# Patient Record
Sex: Female | Born: 1995 | Race: White | Hispanic: No | Marital: Single | Smoking: Never smoker
Health system: Southern US, Community
[De-identification: ages and names within clinical notes are randomized; demographics above are authoritative.]

## PROBLEM LIST (undated history)

## (undated) DIAGNOSIS — F419 Anxiety disorder, unspecified: Secondary | ICD-10-CM

## (undated) DIAGNOSIS — F603 Borderline personality disorder: Secondary | ICD-10-CM

## (undated) DIAGNOSIS — I10 Essential (primary) hypertension: Secondary | ICD-10-CM

---

## 2015-02-16 ENCOUNTER — Encounter (HOSPITAL_BASED_OUTPATIENT_CLINIC_OR_DEPARTMENT_OTHER): Payer: Self-pay | Admitting: Emergency Medicine

## 2015-02-16 ENCOUNTER — Emergency Department (HOSPITAL_BASED_OUTPATIENT_CLINIC_OR_DEPARTMENT_OTHER)
Admission: EM | Admit: 2015-02-16 | Discharge: 2015-02-16 | Disposition: A | Discharge status: Home / Self Care | Payer: Self-pay | Attending: Emergency Medicine | Admitting: Emergency Medicine

## 2015-02-16 DIAGNOSIS — E669 Obesity, unspecified: Secondary | ICD-10-CM | POA: Insufficient documentation

## 2015-02-16 DIAGNOSIS — G43A0 Cyclical vomiting, not intractable: Secondary | ICD-10-CM | POA: Insufficient documentation

## 2015-02-16 DIAGNOSIS — N912 Amenorrhea, unspecified: Secondary | ICD-10-CM | POA: Insufficient documentation

## 2015-02-16 DIAGNOSIS — Z3202 Encounter for pregnancy test, result negative: Secondary | ICD-10-CM | POA: Insufficient documentation

## 2015-02-16 DIAGNOSIS — R1110 Vomiting, unspecified: Secondary | ICD-10-CM

## 2015-02-16 LAB — CBC WITH DIFFERENTIAL/PLATELET
BASOS ABS: 0 10*3/uL (ref 0.0–0.1)
BASOS PCT: 0 % (ref 0–1)
EOS PCT: 1 % (ref 0–5)
Eosinophils Absolute: 0.1 10*3/uL (ref 0.0–0.7)
HEMATOCRIT: 39.4 % (ref 36.0–46.0)
HEMOGLOBIN: 12.6 g/dL (ref 12.0–15.0)
LYMPHS ABS: 3 10*3/uL (ref 0.7–4.0)
Lymphocytes Relative: 29 % (ref 12–46)
MCH: 26.9 pg (ref 26.0–34.0)
MCHC: 32 g/dL (ref 30.0–36.0)
MCV: 84.2 fL (ref 78.0–100.0)
MONOS PCT: 6 % (ref 3–12)
Monocytes Absolute: 0.6 10*3/uL (ref 0.1–1.0)
NEUTROS ABS: 6.6 10*3/uL (ref 1.7–7.7)
Neutrophils Relative %: 64 % (ref 43–77)
Platelets: 352 10*3/uL (ref 150–400)
RBC: 4.68 MIL/uL (ref 3.87–5.11)
RDW: 13.1 % (ref 11.5–15.5)
WBC: 10.5 10*3/uL (ref 4.0–10.5)

## 2015-02-16 LAB — COMPREHENSIVE METABOLIC PANEL
ALK PHOS: 127 U/L — AB (ref 38–126)
ALT: 18 U/L (ref 14–54)
ANION GAP: 9 (ref 5–15)
AST: 23 U/L (ref 15–41)
Albumin: 3.8 g/dL (ref 3.5–5.0)
BUN: 7 mg/dL (ref 6–20)
CO2: 23 mmol/L (ref 22–32)
Calcium: 9.3 mg/dL (ref 8.9–10.3)
Chloride: 105 mmol/L (ref 101–111)
Creatinine, Ser: 0.68 mg/dL (ref 0.44–1.00)
Glucose, Bld: 95 mg/dL (ref 65–99)
POTASSIUM: 4.2 mmol/L (ref 3.5–5.1)
Sodium: 137 mmol/L (ref 135–145)
Total Bilirubin: 0.5 mg/dL (ref 0.3–1.2)
Total Protein: 7.6 g/dL (ref 6.5–8.1)

## 2015-02-16 LAB — URINALYSIS, ROUTINE W REFLEX MICROSCOPIC
Bilirubin Urine: NEGATIVE
Glucose, UA: NEGATIVE mg/dL
Ketones, ur: NEGATIVE mg/dL
Leukocytes, UA: NEGATIVE
NITRITE: NEGATIVE
PH: 5.5 (ref 5.0–8.0)
PROTEIN: NEGATIVE mg/dL
Specific Gravity, Urine: 1.03 (ref 1.005–1.030)
UROBILINOGEN UA: 1 mg/dL (ref 0.0–1.0)

## 2015-02-16 LAB — URINE MICROSCOPIC-ADD ON

## 2015-02-16 LAB — LIPASE, BLOOD: Lipase: 11 U/L — ABNORMAL LOW (ref 22–51)

## 2015-02-16 LAB — HCG, QUANTITATIVE, PREGNANCY: hCG, Beta Chain, Quant, S: <1 m[IU]/mL (ref <5)

## 2015-02-16 MED ORDER — SODIUM CHLORIDE 0.9 % IV BOLUS (SEPSIS)
1000.0000 mL | Freq: Once | INTRAVENOUS | Status: AC
  Administered 2015-02-16: 1000 mL via INTRAVENOUS

## 2015-02-16 MED ORDER — PROMETHAZINE HCL 25 MG PO TABS: 25.0000 mg | ORAL_TABLET | Freq: Four times a day (QID) | ORAL | Status: AC | PRN

## 2015-02-16 MED ORDER — ONDANSETRON HCL 4 MG/2ML IJ SOLN
4.0000 mg | Freq: Once | INTRAMUSCULAR | Status: AC
  Administered 2015-02-16: 4 mg via INTRAVENOUS
  Filled 2015-02-16: qty 2

## 2015-02-16 NOTE — ED Provider Notes (Addendum)
CSN: [id number]     Arrival date & time 02/16/15  0228 History   First MD Initiated Contact with Patient 02/16/15 0239     Chief Complaint  Patient presents with  . Nausea  . Emesis     (Consider location/radiation/quality/duration/timing/severity/associated sxs/prior Treatment) HPI Comments: Patient states her friend works for EMS and check her blood pressure earlier today and it was 210/120. He suggested that she come to the hospital for further care. Patient states in the past she has had an issue with her blood pressure but she takes no medications for it. She was initially triaged to [facility] however the wait was so long she decided to leave. At that time her blood pressure was checked and it was 150/90. She has had nausea and vomiting since 8 AM on Monday morning. She has vomited 6 times and is unable to hold down any food. She's had some mild back pain but denies any severe back pain ongoing abdominal pain. No dysuria, hematuria, vaginal discharge or bleeding. Last menses was January. She states she has issues with ovulation and does not have normal periods.  Patient is a 19 y.o. female presenting with vomiting. The history is provided by the patient.  Emesis Severity:  Severe Duration:  1 day Timing:  Constant Quality:  Stomach contents Progression:  Unchanged Chronicity:  New Relieved by:  None tried Worsened by:  Food smell and liquids Associated symptoms: abdominal pain   Associated symptoms: no chills, no cough, no diarrhea and no fever   Risk factors: no diabetes, not pregnant now, no prior abdominal surgery, no sick contacts, no suspect food intake and no travel to endemic areas     History reviewed. No pertinent past medical history. History reviewed. No pertinent past surgical history. No family history on file. History  Substance Use Topics  . Smoking status: Never Smoker   . Smokeless tobacco: Not on file  . Alcohol Use: No   OB History    No data  available     Review of Systems  Constitutional: Negative for chills.  Gastrointestinal: Positive for vomiting and abdominal pain. Negative for diarrhea.  All other systems reviewed and are negative.     Allergies  Review of patient's allergies indicates no known allergies.  Home Medications   Prior to Admission medications   Not on File   BP 106/75 mmHg  Pulse 88  Temp(Src) 98.5 F (36.9 C) (Oral)  Resp 20  Ht  (1.753 m)  Wt 250 lb (113.399 kg)  BMI 36.90 kg/m2  SpO2 99% Physical Exam  Constitutional: She is oriented to person, place, and time. She appears well-developed and well-nourished. No distress.  Obese female  HENT:  Head: Normocephalic and atraumatic.  Mouth/Throat: Oropharynx is clear and moist. Mucous membranes are dry.  Eyes: Conjunctivae and EOM are normal. Pupils are equal, round, and reactive to light.  Neck: Normal range of motion. Neck supple.  Cardiovascular: Normal rate, regular rhythm and intact distal pulses.   No murmur heard. Pulmonary/Chest: Effort normal and breath sounds normal. No respiratory distress. She has no wheezes. She has no rales.  Abdominal: Soft. She exhibits no distension. There is tenderness in the right upper quadrant, right lower quadrant, suprapubic area and left lower quadrant. There is no rebound and no guarding.  Diffuse abdominal tenderness which patient states is chronic  Musculoskeletal: Normal range of motion. She exhibits no edema or tenderness.  Neurological: She is alert and oriented to person, place, and  time.  Skin: Skin is warm and dry. No rash noted. No erythema.  Psychiatric: She has a normal mood and affect. Her behavior is normal.  Nursing note and vitals reviewed.   ED Course  Procedures (including critical care time) Labs Review Labs Reviewed  URINALYSIS, ROUTINE W REFLEX MICROSCOPIC (NOT AT [facility]) - Abnormal; Notable for the following:    APPearance CLOUDY (*)    Hgb urine dipstick MODERATE (*)     All other components within normal limits  COMPREHENSIVE METABOLIC PANEL - Abnormal; Notable for the following:    Alkaline Phosphatase 127 (*)    All other components within normal limits  LIPASE, BLOOD - Abnormal; Notable for the following:    Lipase 11 (*)    All other components within normal limits  URINE MICROSCOPIC-ADD ON - Abnormal; Notable for the following:    Squamous Epithelial / LPF MANY (*)    Bacteria, UA MANY (*)    All other components within normal limits  CBC WITH DIFFERENTIAL/PLATELET  HCG, QUANTITATIVE, PREGNANCY    Imaging Review No results found.   EKG Interpretation None      MDM   Final diagnoses:  Intractable vomiting with nausea, vomiting of unspecified type  Anovulatory amenorrhea    Patient here with persistent nausea and vomiting since yesterday and had a.m. Earlier her blood pressure was elevated to 210/120 checked recurrently with persistent high numbers. She initially went to [facility] emergency room and her pressure was 150 systolic fair however by the time she checked in here her blood pressure was 106/75. She has a history of fluctuating blood pressures but denies taking any medication. She's not had a normal menses since January but states she has an issue with ovulating. She denies any vaginal discharge or dysuria. No prior history of kidney stones. She does not use tobacco, alcohol or drugs. On exam she has mild diffuse abdominal tenderness which she states is her baseline. She denies any diarrhea today. No recent and extensor concern for C. difficile at this time.  UA, UPT, CBC, CMP, lipase pending. We'll give IV fluids and nausea medication. Low suspicion at this time the patient has appendicitis however possibility for cholecystitis. Will see how patient feels after IV hydration. We'll discuss possible ultrasound if labs are within normal limits  4:15 AM  HCG and WBC wnl.  Rest of labs still pending.  5:12 AM Labs are all wnl.  Pt has  had no vomiting here and able to po challenge without difficulty.  Pt will need f/u with OB/GYN due to irregular periods but at this time do not feel that patient has new acute issue that needs further evaluation. She lives in [place] and will seek follow-up there.  Gwyneth Sprout, MD 02/16/15 [id number]  Gwyneth Sprout, MD 02/16/15 1191

## 2015-02-16 NOTE — ED Notes (Signed)
MD at bedside. 

## 2015-02-16 NOTE — ED Notes (Signed)
C/o N/V all day yesterday since 0800. Report BP was 210/120 around 12 noon at home. bp 106/75 at triage. Denies taking any medications.

## 2015-02-16 NOTE — Discharge Instructions (Signed)

## 2015-02-18 ENCOUNTER — Emergency Department (HOSPITAL_BASED_OUTPATIENT_CLINIC_OR_DEPARTMENT_OTHER): Payer: Self-pay

## 2015-02-18 ENCOUNTER — Emergency Department (HOSPITAL_BASED_OUTPATIENT_CLINIC_OR_DEPARTMENT_OTHER)
Admission: EM | Admit: 2015-02-18 | Discharge: 2015-02-18 | Disposition: A | Discharge status: Home / Self Care | Payer: Self-pay | Attending: Emergency Medicine | Admitting: Emergency Medicine

## 2015-02-18 ENCOUNTER — Encounter (HOSPITAL_BASED_OUTPATIENT_CLINIC_OR_DEPARTMENT_OTHER): Payer: Self-pay

## 2015-02-18 DIAGNOSIS — R112 Nausea with vomiting, unspecified: Secondary | ICD-10-CM | POA: Insufficient documentation

## 2015-02-18 DIAGNOSIS — E669 Obesity, unspecified: Secondary | ICD-10-CM | POA: Insufficient documentation

## 2015-02-18 DIAGNOSIS — R110 Nausea: Secondary | ICD-10-CM

## 2015-02-18 DIAGNOSIS — R1084 Generalized abdominal pain: Secondary | ICD-10-CM | POA: Insufficient documentation

## 2015-02-18 DIAGNOSIS — Z3202 Encounter for pregnancy test, result negative: Secondary | ICD-10-CM | POA: Insufficient documentation

## 2015-02-18 DIAGNOSIS — R319 Hematuria, unspecified: Secondary | ICD-10-CM | POA: Insufficient documentation

## 2015-02-18 LAB — URINALYSIS, ROUTINE W REFLEX MICROSCOPIC
Bilirubin Urine: NEGATIVE
Glucose, UA: NEGATIVE mg/dL
Ketones, ur: NEGATIVE mg/dL
Leukocytes, UA: NEGATIVE
NITRITE: NEGATIVE
Protein, ur: NEGATIVE mg/dL
SPECIFIC GRAVITY, URINE: 1.025 (ref 1.005–1.030)
Urobilinogen, UA: 1 mg/dL (ref 0.0–1.0)
pH: 5.5 (ref 5.0–8.0)

## 2015-02-18 LAB — URINE MICROSCOPIC-ADD ON

## 2015-02-18 LAB — PREGNANCY, URINE: Preg Test, Ur: NEGATIVE

## 2015-02-18 MED ORDER — RANITIDINE HCL 150 MG PO TABS: 150.0000 mg | ORAL_TABLET | Freq: Two times a day (BID) | ORAL | Status: AC

## 2015-02-18 NOTE — ED Provider Notes (Signed)
CSN: [id number]     Arrival date & time 02/18/15  0927 History   First MD Initiated Contact with Patient 02/18/15 [phone]     Chief Complaint  Patient presents with  . Emesis     (Consider location/radiation/quality/duration/timing/severity/associated sxs/prior Treatment) HPI Comments: Patient presents with nausea and vomiting. She states that for about the last 6-7 months she's had intermittent episodes of nausea and vomiting. She describes soreness across her abdomen she is also been going on since about January. It comes and goes and isn't different spots in her abdomen. She's had some sharp pains to her right upper quadrant over the last few days. She was seen here on August 2 with similar symptoms and had blood work that was unremarkable. Her alkaline phosphatase was mildly elevated. She was under the impression that she was going to have an outpatient gallbladder ultrasound ordered. Apparently there is a miscommunication about this so she checked back in for reevaluation to get the gallbladder ultrasound. She normally has irregular periods and states that she has ovulation issues. She denies any vaginal bleeding or discharge. She denies any urinary symptoms. She denies any fevers or chills. Her last emesis was one time yesterday. She's been tolerating food since that time without vomiting.  Patient is a 19 y.o. female presenting with vomiting.  Emesis Associated symptoms: abdominal pain   Associated symptoms: no arthralgias, no chills, no diarrhea and no headaches     History reviewed. No pertinent past medical history. History reviewed. No pertinent past surgical history. No family history on file. History  Substance Use Topics  . Smoking status: Never Smoker   . Smokeless tobacco: Not on file  . Alcohol Use: No   OB History    No data available     Review of Systems  Constitutional: Negative for fever, chills, diaphoresis and fatigue.  HENT: Negative for congestion, rhinorrhea  and sneezing.   Eyes: Negative.   Respiratory: Negative for cough, chest tightness and shortness of breath.   Cardiovascular: Negative for chest pain and leg swelling.  Gastrointestinal: Positive for nausea, vomiting and abdominal pain. Negative for diarrhea and blood in stool.  Genitourinary: Negative for frequency, hematuria, flank pain, vaginal bleeding, vaginal discharge and difficulty urinating.  Musculoskeletal: Negative for back pain and arthralgias.  Skin: Negative for rash.  Neurological: Negative for dizziness, speech difficulty, weakness, numbness and headaches.      Allergies  Review of patient's allergies indicates no known allergies.  Home Medications   Prior to Admission medications   Medication Sig Start Date End Date Taking? Authorizing Provider  promethazine (PHENERGAN) 25 MG tablet Take 1 tablet (25 mg total) by mouth every 6 (six) hours as needed for nausea or vomiting. 02/16/15   Gwyneth Sprout, MD  ranitidine (ZANTAC) 150 MG tablet Take 1 tablet (150 mg total) by mouth 2 (two) times daily. 02/18/15   Rolan Bucco, MD   BP 126/72 mmHg  Pulse 75  Temp(Src) 98.5 F (36.9 C) (Oral)  Resp 18  Ht  (1.753 m)  Wt 250 lb (113.399 kg)  BMI 36.90 kg/m2  SpO2 100%  LMP 08/05/2014 Physical Exam  Constitutional: She is oriented to person, place, and time. She appears well-developed and well-nourished.  obese  HENT:  Head: Normocephalic and atraumatic.  Eyes: Pupils are equal, round, and reactive to light.  Neck: Normal range of motion. Neck supple.  Cardiovascular: Normal rate, regular rhythm and normal heart sounds.   Pulmonary/Chest: Effort normal and breath sounds normal. No  respiratory distress. She has no wheezes. She has no rales. She exhibits no tenderness.  Abdominal: Soft. Bowel sounds are normal. There is tenderness (mild diffuse tenderness). There is no rebound and no guarding.  Musculoskeletal: Normal range of motion. She exhibits no edema.   Lymphadenopathy:    She has no cervical adenopathy.  Neurological: She is alert and oriented to person, place, and time.  Skin: Skin is warm and dry. No rash noted.  Psychiatric: She has a normal mood and affect.    ED Course  Procedures (including critical care time) Labs Review Labs Reviewed  URINALYSIS, ROUTINE W REFLEX MICROSCOPIC (NOT AT [facility]) - Abnormal; Notable for the following:    APPearance CLOUDY (*)    Hgb urine dipstick MODERATE (*)    All other components within normal limits  URINE MICROSCOPIC-ADD ON - Abnormal; Notable for the following:    Squamous Epithelial / LPF FEW (*)    Bacteria, UA MANY (*)    All other components within normal limits  PREGNANCY, URINE    Imaging Review US Abdomen Complete  02/18/2015   CLINICAL DATA:  Sporadic diffuse abdominal pain and nausea for 4-5 months.  EXAM: ULTRASOUND ABDOMEN COMPLETE  COMPARISON:  None.  FINDINGS: Gallbladder: Gallbladder has a normal appearance. Gallbladder wall is 2.0 mm, within normal limits. No stones or pericholecystic fluid. No sonographic Murphy's sign.  Common bile duct: Diameter: 2.0 mm  Liver: Mildly echogenic.  No focal liver lesions.  IVC: No abnormality visualized.  Pancreas: Visualized portion unremarkable.  Spleen: Size and appearance within normal limits.  Right Kidney: Length: 11.7 cm. Echogenicity within normal limits. No mass or hydronephrosis visualized.  Left Kidney: Length: 11.7 cm. Echogenicity within normal limits. No mass or hydronephrosis visualized.  Abdominal aorta: No aneurysm visualized.  Other findings: Study is technically compromised by significant bowel gas and patient body habitus.  IMPRESSION: 1. Mildly echogenic liver. 2. No evidence for acute  abnormality.   Electronically Signed   By: Norva Pavlov M.D.   On: 02/18/2015 11:27     EKG Interpretation None      MDM   Final diagnoses:  Nausea  Generalized abdominal pain  Hematuria    Patient presents with nausea and  generalized abdominal pain. She states her abdominal pain is been going on since about January. It comes and goes. Her abdominal exam is non-concerning. I did do an ultrasound which was unremarkable. She has some microscopic hematuria which was also noted on her last urine 2 days ago. She denies any vaginal bleeding. She denies any urinary symptoms. I encouraged her to follow-up with her primary care physician for this. She also had blood work done 2 days ago which was normal other than elevated alkaline phosphatase, so I did not feel that this need to be repeated today. I did refer her to [facility] for further treatment. I gave her prescription for Zantac and she can continue her Phenergan as needed.    Rolan Bucco, MD 02/18/15 [phone]

## 2015-02-18 NOTE — Discharge Instructions (Signed)
Abdominal Pain, Women °Abdominal (stomach, pelvic, or belly) pain can be caused by many things. It is important to tell your doctor: °· The location of the pain. °· Does it come and go or is it present all the time? °· Are there things that start the pain (eating certain foods, exercise)? °· Are there other symptoms associated with the pain (fever, nausea, vomiting, diarrhea)? °All of this is helpful to know when trying to find the cause of the pain. °CAUSES  °· Stomach: virus or bacteria infection, or ulcer. °· Intestine: appendicitis (inflamed appendix), regional ileitis (Crohn's disease), ulcerative colitis (inflamed colon), irritable bowel syndrome, diverticulitis (inflamed diverticulum of the colon), or cancer of the stomach or intestine. °· Gallbladder disease or stones in the gallbladder. °· Kidney disease, kidney stones, or infection. °· Pancreas infection or cancer. °· Fibromyalgia (pain disorder). °· Diseases of the female organs: °· Uterus: fibroid (non-cancerous) tumors or infection. °· Fallopian tubes: infection or tubal pregnancy. °· Ovary: cysts or tumors. °· Pelvic adhesions (scar tissue). °· Endometriosis (uterus lining tissue growing in the pelvis and on the pelvic organs). °· Pelvic congestion syndrome (female organs filling up with blood just before the menstrual period). °· Pain with the menstrual period. °· Pain with ovulation (producing an egg). °· Pain with an IUD (intrauterine device, birth control) in the uterus. °· Cancer of the female organs. °· Functional pain (pain not caused by a disease, may improve without treatment). °· Psychological pain. °· Depression. °DIAGNOSIS  °Your doctor will decide the seriousness of your pain by doing an examination. °· Blood tests. °· X-rays. °· Ultrasound. °· CT scan (computed tomography, special type of X-ray). °· MRI (magnetic resonance imaging). °· Cultures, for infection. °· Barium enema (dye inserted in the large intestine, to better view it with  X-rays). °· Colonoscopy (looking in intestine with a lighted tube). °· Laparoscopy (minor surgery, looking in abdomen with a lighted tube). °· Major abdominal exploratory surgery (looking in abdomen with a large incision). °TREATMENT  °The treatment will depend on the cause of the pain.  °· Many cases can be observed and treated at home. °· Over-the-counter medicines recommended by your caregiver. °· Prescription medicine. °· Antibiotics, for infection. °· Birth control pills, for painful periods or for ovulation pain. °· Hormone treatment, for endometriosis. °· Nerve blocking injections. °· Physical therapy. °· Antidepressants. °· Counseling with a psychologist or psychiatrist. °· Minor or major surgery. °HOME CARE INSTRUCTIONS  °· Do not take laxatives, unless directed by your caregiver. °· Take over-the-counter pain medicine only if ordered by your caregiver. Do not take aspirin because it can cause an upset stomach or bleeding. °· Try a clear liquid diet (broth or water) as ordered by your caregiver. Slowly move to a bland diet, as tolerated, if the pain is related to the stomach or intestine. °· Have a thermometer and take your temperature several times a day, and record it. °· Bed rest and sleep, if it helps the pain. °· Avoid sexual intercourse, if it causes pain. °· Avoid stressful situations. °· Keep your follow-up appointments and tests, as your caregiver orders. °· If the pain does not go away with medicine or surgery, you may try: °¨ Acupuncture. °¨ Relaxation exercises (yoga, meditation). °¨ Group therapy. °¨ Counseling. °SEEK MEDICAL CARE IF:  °· You notice certain foods cause stomach pain. °· Your home care treatment is not helping your pain. °· You need stronger pain medicine. °· You want your IUD removed. °· You feel faint or   lightheaded.  You develop nausea and vomiting.  You develop a rash.  You are having side effects or an allergy to your medicine. SEEK IMMEDIATE MEDICAL CARE IF:   Your  pain does not go away or gets worse.  You have a fever.  Your pain is felt only in portions of the abdomen. The right side could possibly be appendicitis. The left lower portion of the abdomen could be colitis or diverticulitis.  You are passing blood in your stools (bright red or black tarry stools, with or without vomiting).  You have blood in your urine.  You develop chills, with or without a fever.  You pass out. MAKE SURE YOU:   Understand these instructions.  Will watch your condition.  Will get help right away if you are not doing well or get worse. Document Released: 04/30/2007 Document Revised: 11/17/2013 Document Reviewed: 05/20/2009 [facility] Patient Information 2015 [place], [place]. This information is not intended to replace advice given to you by your health care provider. Make sure you discuss any questions you have with your health care provider.  Hematuria Hematuria is blood in your urine. It can be caused by a bladder infection, kidney infection, prostate infection, kidney stone, or cancer of your urinary tract. Infections can usually be treated with medicine, and a kidney stone usually will pass through your urine. If neither of these is the cause of your hematuria, further workup to find out the reason may be needed. It is very important that you tell your health care provider about any blood you see in your urine, even if the blood stops without treatment or happens without causing pain. Blood in your urine that happens and then stops and then happens again can be a symptom of a very serious condition. Also, pain is not a symptom in the initial stages of many urinary cancers. HOME CARE INSTRUCTIONS   Drink lots of fluid, 3-4 quarts a day. If you have been diagnosed with an infection, cranberry juice is especially recommended, in addition to large amounts of water.  Avoid caffeine, tea, and carbonated beverages because they tend to irritate the bladder.  Avoid  alcohol because it may irritate the prostate.  Take all medicines as directed by your health care provider.  If you were prescribed an antibiotic medicine, finish it all even if you start to feel better.  If you have been diagnosed with a kidney stone, follow your health care provider's instructions regarding straining your urine to catch the stone.  Empty your bladder often. Avoid holding urine for long periods of time.  After a bowel movement, women should cleanse front to back. Use each tissue only once.  Empty your bladder before and after sexual intercourse if you are a female. SEEK MEDICAL CARE IF:  You develop back pain.  You have a fever.  You have a feeling of sickness in your stomach (nausea) or vomiting.  Your symptoms are not better in 3 days. Return sooner if you are getting worse. SEEK IMMEDIATE MEDICAL CARE IF:   You develop severe vomiting and are unable to keep the medicine down.  You develop severe back or abdominal pain despite taking your medicines.  You begin passing a large amount of blood or clots in your urine.  You feel extremely weak or faint, or you pass out. MAKE SURE YOU:   Understand these instructions.  Will watch your condition.  Will get help right away if you are not doing well or get worse. Document Released:  07/03/2005 Document Revised: 11/17/2013 Document Reviewed: 03/03/2013 [facility] Patient Information 2015 [place], [place]. This information is not intended to replace advice given to you by your health care provider. Make sure you discuss any questions you have with your health care provider.

## 2015-02-18 NOTE — ED Notes (Signed)
MD at bedside. 

## 2015-02-18 NOTE — ED Notes (Signed)
Pt transported to [place] by walking

## 2015-02-18 NOTE — ED Notes (Signed)
Pt reports she was here 2 days ago. Reports she was supposed to have ultrasound, but it wasn't available. Reports last emesis was yesterday around noon. Pt sts she ate last night without any emesis.

## 2015-09-11 IMAGING — US US ABDOMEN COMPLETE
1 series · 14 of 25 positions shown · non-contrast
Comparison: None.

CLINICAL DATA: Sporadic diffuse abdominal pain and nausea for 4-5
months.

EXAM:
ULTRASOUND ABDOMEN COMPLETE

[Series 1: us abdomen complete · 0.21mm/px · 14 of 67 slices shown]
[im 1/67]
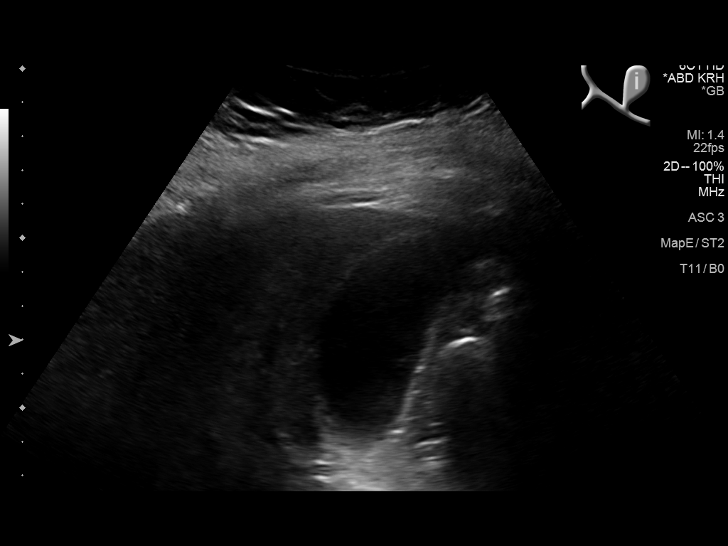
[im 6/67]
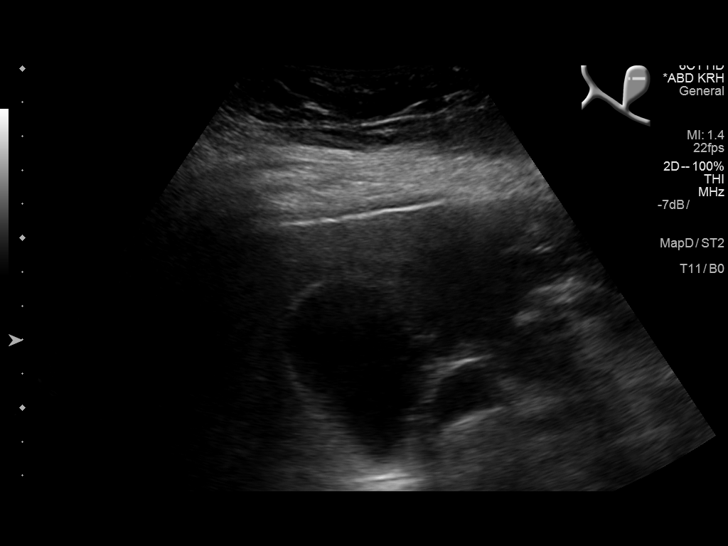
[im 12/67]
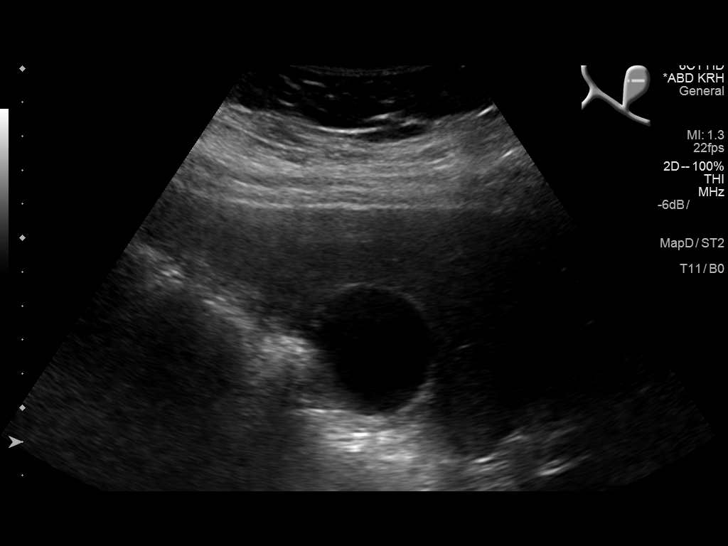
[im 17/67]
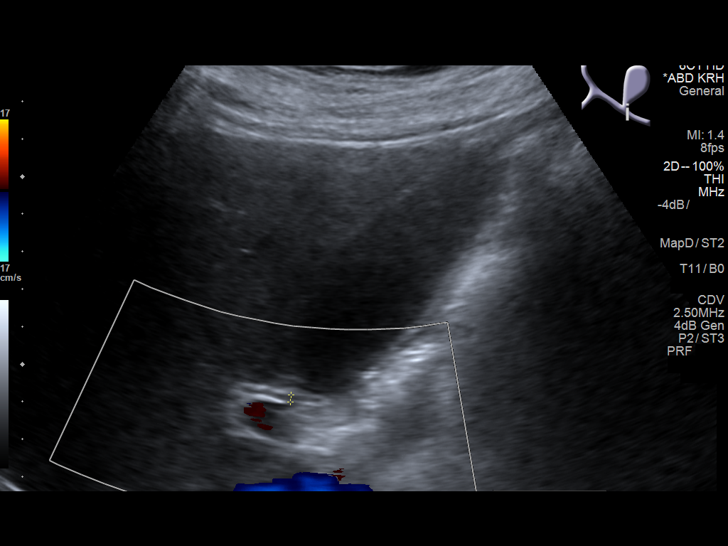
[im 23/67]
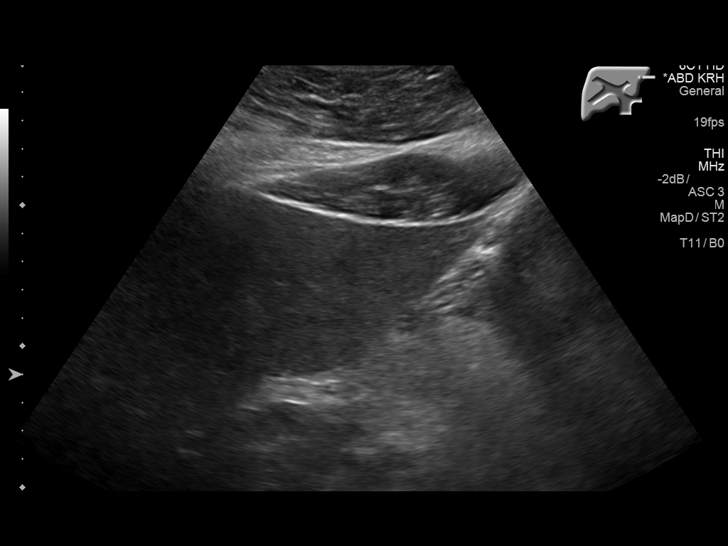
[im 25/67]
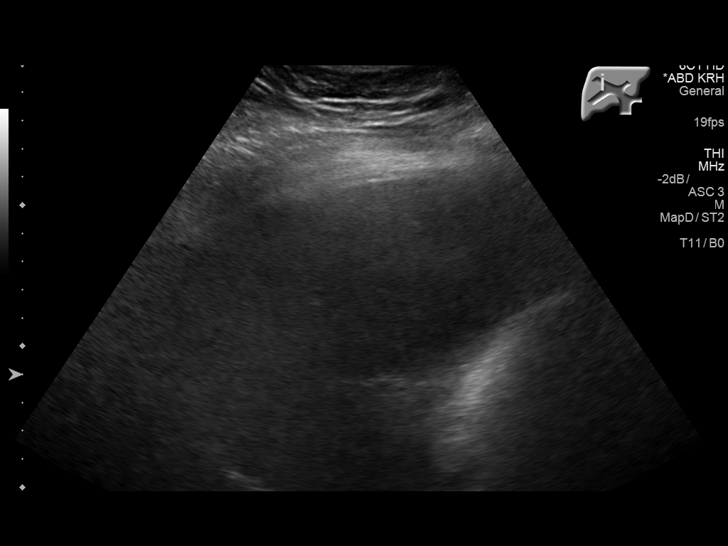
[im 31/67]
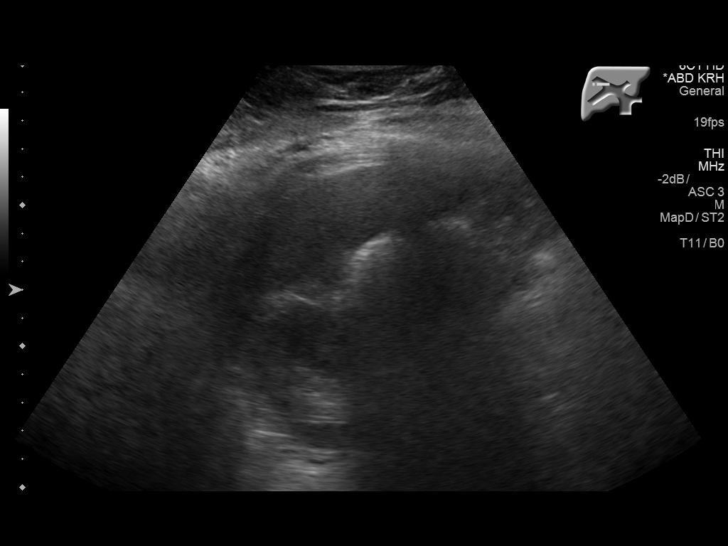
[im 36/67]
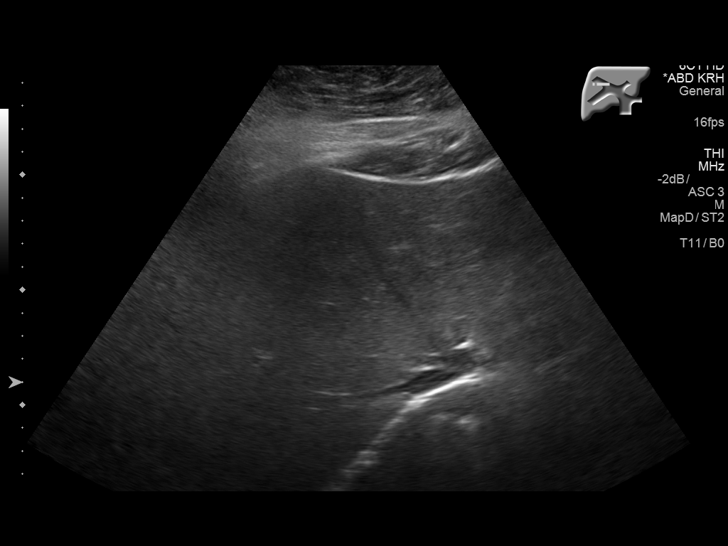
[im 42/67]
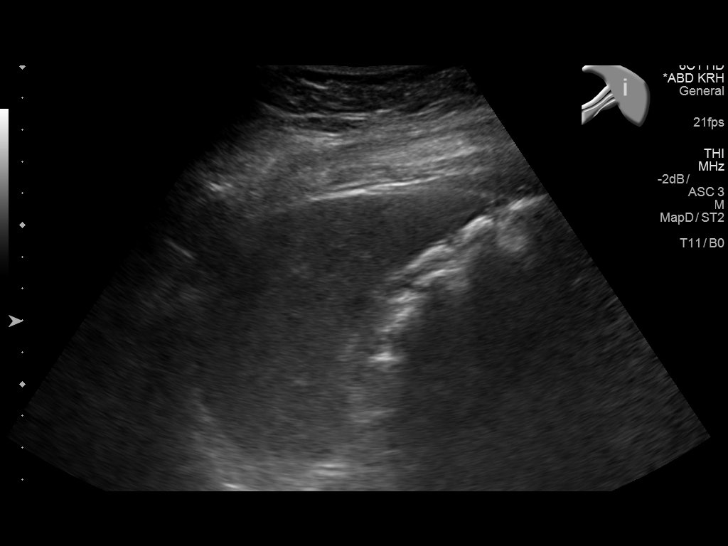
[im 45/67]
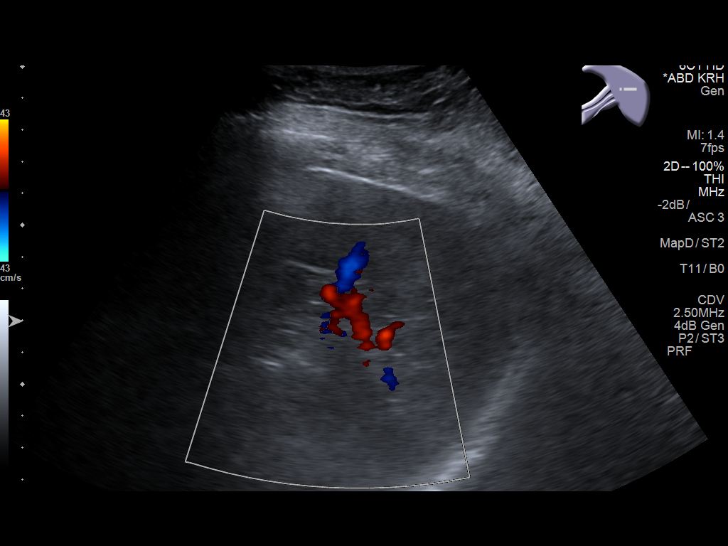
[im 50/67]
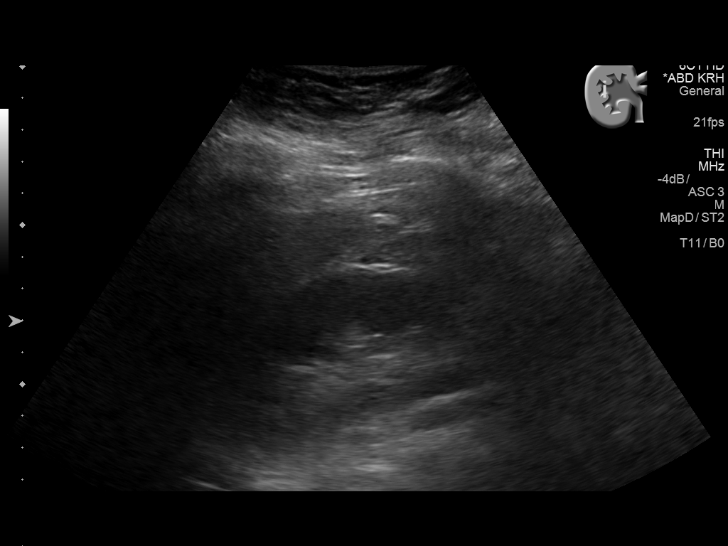
[im 56/67]
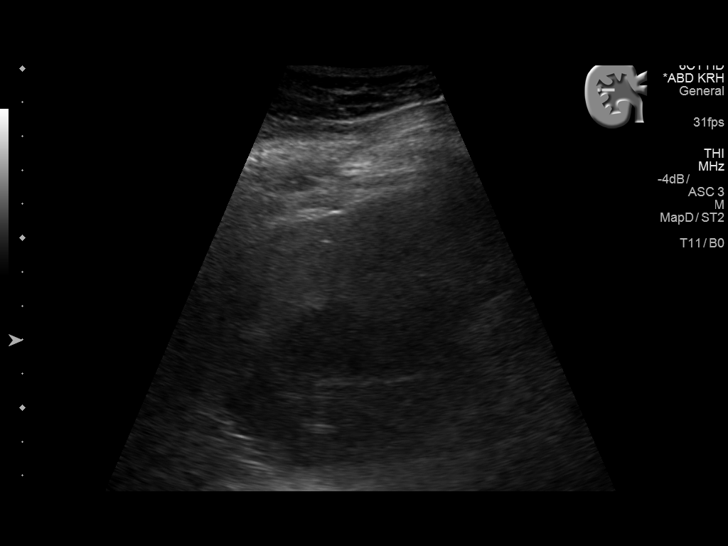
[im 61/67]
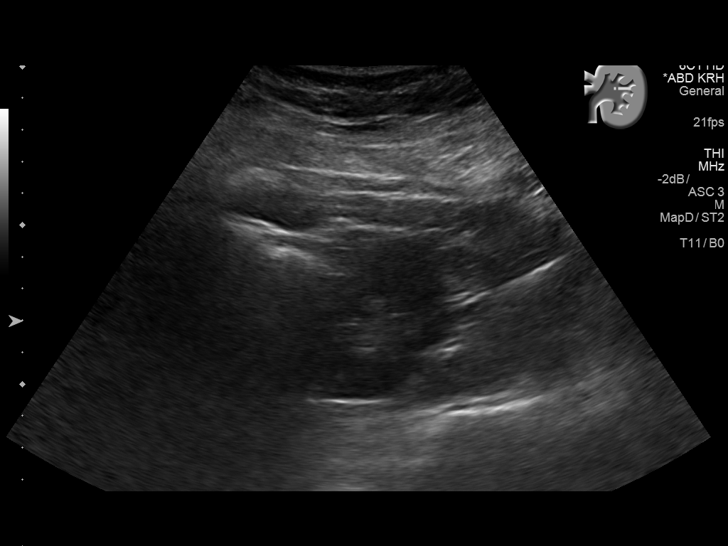
[im 67/67]
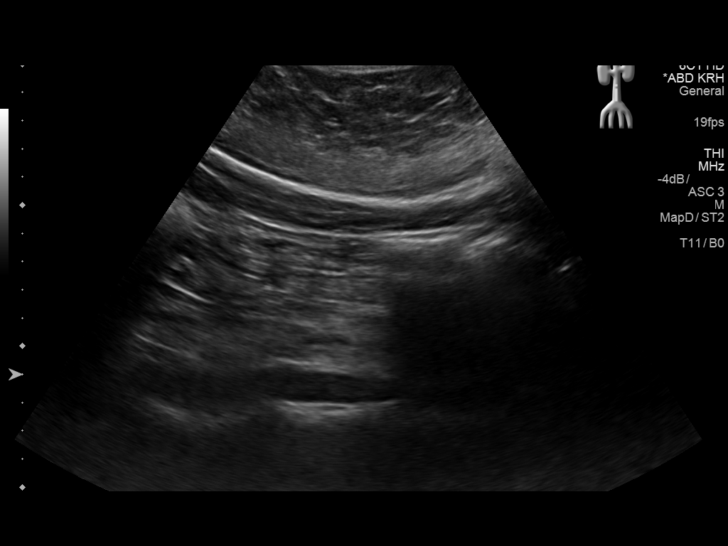

[14 of 25 positions shown; findings below may reference images not displayed]

FINDINGS: Gallbladder: Gallbladder has a normal appearance. Gallbladder wall
is 2.0 mm, within normal limits. No stones or pericholecystic fluid.
No sonographic Murphy's sign.

Common bile duct: Diameter: 2.0 mm

Liver: Mildly echogenic.  No focal liver lesions.

IVC: No abnormality visualized.

Pancreas: Visualized portion unremarkable.

Spleen: Size and appearance within normal limits.

Right Kidney: Length: 11.7 cm. Echogenicity within normal limits. No
mass or hydronephrosis visualized.

Left Kidney: Length: 11.7 cm. Echogenicity within normal limits. No
mass or hydronephrosis visualized.

Abdominal aorta: No aneurysm visualized.

Other findings: Study is technically compromised by significant
bowel gas and patient body habitus.
IMPRESSION: 1. Mildly echogenic liver.
2. No evidence for acute  abnormality.

## 2019-10-28 ENCOUNTER — Encounter (HOSPITAL_COMMUNITY): Payer: Self-pay

## 2019-10-28 ENCOUNTER — Other Ambulatory Visit: Payer: Self-pay

## 2019-10-28 ENCOUNTER — Emergency Department (HOSPITAL_COMMUNITY)
Admission: EM | Admit: 2019-10-28 | Discharge: 2019-10-28 | Disposition: A | Discharge status: Home / Self Care | Payer: Medicaid - Out of State | Attending: Emergency Medicine | Admitting: Emergency Medicine

## 2019-10-28 DIAGNOSIS — R251 Tremor, unspecified: Secondary | ICD-10-CM | POA: Insufficient documentation

## 2019-10-28 DIAGNOSIS — R079 Chest pain, unspecified: Secondary | ICD-10-CM | POA: Diagnosis not present

## 2019-10-28 DIAGNOSIS — R413 Other amnesia: Secondary | ICD-10-CM | POA: Diagnosis not present

## 2019-10-28 DIAGNOSIS — E039 Hypothyroidism, unspecified: Secondary | ICD-10-CM | POA: Insufficient documentation

## 2019-10-28 DIAGNOSIS — R5383 Other fatigue: Secondary | ICD-10-CM | POA: Insufficient documentation

## 2019-10-28 DIAGNOSIS — R531 Weakness: Secondary | ICD-10-CM | POA: Insufficient documentation

## 2019-10-28 DIAGNOSIS — I10 Essential (primary) hypertension: Secondary | ICD-10-CM | POA: Diagnosis not present

## 2019-10-28 DIAGNOSIS — Z79899 Other long term (current) drug therapy: Secondary | ICD-10-CM | POA: Insufficient documentation

## 2019-10-28 DIAGNOSIS — R42 Dizziness and giddiness: Secondary | ICD-10-CM | POA: Diagnosis not present

## 2019-10-28 HISTORY — DX: Borderline personality disorder: F60.3

## 2019-10-28 HISTORY — DX: Essential (primary) hypertension: I10

## 2019-10-28 HISTORY — DX: Anxiety disorder, unspecified: F41.9

## 2019-10-28 LAB — COMPREHENSIVE METABOLIC PANEL
ALT: 30 U/L (ref 0–44)
AST: 26 U/L (ref 15–41)
Albumin: 3.9 g/dL (ref 3.5–5.0)
Alkaline Phosphatase: 121 U/L (ref 38–126)
Anion gap: 11 (ref 5–15)
BUN: 12 mg/dL (ref 6–20)
CO2: 25 mmol/L (ref 22–32)
Calcium: 8.8 mg/dL — ABNORMAL LOW (ref 8.9–10.3)
Chloride: 101 mmol/L (ref 98–111)
Creatinine, Ser: 0.69 mg/dL (ref 0.44–1.00)
GFR calc Af Amer: >60 mL/min (ref >60)
GFR calc non Af Amer: >60 mL/min (ref >60)
Glucose, Bld: 83 mg/dL (ref 70–99)
Potassium: 3.9 mmol/L (ref 3.5–5.1)
Sodium: 137 mmol/L (ref 135–145)
Total Bilirubin: 0.3 mg/dL (ref 0.3–1.2)
Total Protein: 7.7 g/dL (ref 6.5–8.1)

## 2019-10-28 LAB — RAPID URINE DRUG SCREEN, HOSP PERFORMED
Amphetamines: NOT DETECTED
Barbiturates: NOT DETECTED
Benzodiazepines: NOT DETECTED
Cocaine: NOT DETECTED
Opiates: NOT DETECTED
Tetrahydrocannabinol: POSITIVE — AB

## 2019-10-28 LAB — URINALYSIS, ROUTINE W REFLEX MICROSCOPIC
Bacteria, UA: NONE SEEN
Bilirubin Urine: NEGATIVE
Glucose, UA: NEGATIVE mg/dL
Ketones, ur: NEGATIVE mg/dL
Leukocytes,Ua: NEGATIVE
Nitrite: NEGATIVE
Protein, ur: NEGATIVE mg/dL
RBC / HPF: >50 RBC/hpf — ABNORMAL HIGH (ref 0–5)
Specific Gravity, Urine: 1.018 (ref 1.005–1.030)
pH: 7 (ref 5.0–8.0)

## 2019-10-28 LAB — LITHIUM LEVEL: Lithium Lvl: 0.17 mmol/L — ABNORMAL LOW (ref 0.60–1.20)

## 2019-10-28 LAB — CBC WITH DIFFERENTIAL/PLATELET
Abs Immature Granulocytes: 0.04 10*3/uL (ref 0.00–0.07)
Basophils Absolute: 0.1 10*3/uL (ref 0.0–0.1)
Basophils Relative: 1 %
Eosinophils Absolute: 0.3 10*3/uL (ref 0.0–0.5)
Eosinophils Relative: 2 %
HCT: 37.2 % (ref 36.0–46.0)
Hemoglobin: 11.5 g/dL — ABNORMAL LOW (ref 12.0–15.0)
Immature Granulocytes: 0 %
Lymphocytes Relative: 22 %
Lymphs Abs: 2.7 10*3/uL (ref 0.7–4.0)
MCH: 28.4 pg (ref 26.0–34.0)
MCHC: 30.9 g/dL (ref 30.0–36.0)
MCV: 91.9 fL (ref 80.0–100.0)
Monocytes Absolute: 0.6 10*3/uL (ref 0.1–1.0)
Monocytes Relative: 5 %
Neutro Abs: 8.7 10*3/uL — ABNORMAL HIGH (ref 1.7–7.7)
Neutrophils Relative %: 70 %
Platelets: 391 10*3/uL (ref 150–400)
RBC: 4.05 MIL/uL (ref 3.87–5.11)
RDW: 12.7 % (ref 11.5–15.5)
WBC: 12.4 10*3/uL — ABNORMAL HIGH (ref 4.0–10.5)
nRBC: 0 % (ref 0.0–0.2)

## 2019-10-28 LAB — TSH: TSH: 7.455 u[IU]/mL — ABNORMAL HIGH (ref 0.350–4.500)

## 2019-10-28 LAB — PREGNANCY, URINE: Preg Test, Ur: NEGATIVE

## 2019-10-28 MED ORDER — SODIUM CHLORIDE 0.9 % IV BOLUS
1000.0000 mL | Freq: Once | INTRAVENOUS | Status: AC
  Administered 2019-10-28: 1000 mL via INTRAVENOUS

## 2019-10-28 MED ORDER — LITHIUM CARBONATE 300 MG PO CAPS
300.0000 mg | ORAL_CAPSULE | Freq: Once | ORAL | Status: AC
  Administered 2019-10-28: 23:00:00 300 mg via ORAL
  Filled 2019-10-28: qty 1

## 2019-10-28 NOTE — ED Provider Notes (Signed)
[facility] EMERGENCY DEPARTMENT Provider Note   CSN: [id number] Arrival date & time: 10/28/19  1749     History Chief Complaint  Patient presents with  . Tremors    Carol Flores is a 24 y.o. female.  HPI      Carol Flores is a 24 y.o. female, with a history of anxiety and HTN, presenting to the ED sent by her PCP for concern for possible lithium toxicity. For at least the last 2 weeks she has had worsening symptoms of nausea, memory dysfunction and retention of new information, foggy headedness, fatigue, generalized weakness, intermittent chest pain, and intermittent hand tremors  She began taking lithium for the first time 4 weeks ago.  They increased her dosage every week.  The most recent dosing was 300 mg twice daily and this occurred about a week ago.  She called her PCP today and they advised her to stop taking the lithium and come to the ED.  An additional history element is that patient delivered a baby girl March 2.  She is not breast-feeding.  Denies fever/chills, persistent confusion, personality changes, syncope, dizziness, numbness, isolated extremity weakness, shortness of breath, abdominal pain, cough, seizures, or any other complaints.   Past Medical History:  Diagnosis Date  . Anxiety   . Borderline personality disorder (HCC)   . Hypertension     There are no problems to display for this patient.   Past Surgical History:  Procedure Laterality Date  . CESAREAN SECTION       OB History   No obstetric history on file.     History reviewed. No pertinent family history.  Social History   Tobacco Use  . Smoking status: Never Smoker  . Smokeless tobacco: Never Used  Substance Use Topics  . Alcohol use: No  . Drug use: No    Home Medications Prior to Admission medications   Medication Sig Start Date End Date Taking? Authorizing Provider  promethazine (PHENERGAN) 25 MG tablet Take 1 tablet (25 mg total) by mouth every 6 (six) hours as needed for  nausea or vomiting. 02/16/15   Gwyneth Sprout, MD  ranitidine (ZANTAC) 150 MG tablet Take 1 tablet (150 mg total) by mouth 2 (two) times daily. 02/18/15   Rolan Bucco, MD    Allergies    Patient has no known allergies.  Review of Systems   Review of Systems  Constitutional: Positive for fatigue. Negative for chills, diaphoresis and fever.  Eyes: Negative for visual disturbance.  Respiratory: Negative for cough and shortness of breath.   Cardiovascular: Positive for chest pain. Negative for leg swelling.  Gastrointestinal: Positive for nausea. Negative for abdominal pain, blood in stool, diarrhea and vomiting.  Musculoskeletal: Negative for neck pain.  Neurological: Positive for tremors and weakness. Negative for dizziness, seizures, syncope and numbness.  Psychiatric/Behavioral: Positive for decreased concentration.  All other systems reviewed and are negative.   Physical Exam Updated Vital Signs BP 136/75 (BP Location: Right Arm)   Pulse 95   Temp 97.7 F (36.5 C) (Oral)   Resp 16   Ht 5\' 7"  (1.702 m)   Wt 114.3 kg   LMP 10/28/2019   SpO2 99%   BMI 39.47 kg/m   Physical Exam Vitals and nursing note reviewed.  Constitutional:      General: She is not in acute distress.    Appearance: She is well-developed. She is not diaphoretic.  HENT:     Head: Normocephalic and atraumatic.     Mouth/Throat:  Mouth: Mucous membranes are moist.     Pharynx: Oropharynx is clear.  Eyes:     Extraocular Movements: Extraocular movements intact.     Conjunctiva/sclera: Conjunctivae normal.     Pupils: Pupils are equal, round, and reactive to light.  Cardiovascular:     Rate and Rhythm: Normal rate and regular rhythm.     Pulses: Normal pulses.          Radial pulses are 2+ on the right side and 2+ on the left side.       Posterior tibial pulses are 2+ on the right side and 2+ on the left side.     Heart sounds: Normal heart sounds.     Comments: Tactile temperature in the  extremities appropriate and equal bilaterally. Pulmonary:     Effort: Pulmonary effort is normal. No respiratory distress.     Breath sounds: Normal breath sounds.  Abdominal:     Palpations: Abdomen is soft.     Tenderness: There is no abdominal tenderness. There is no guarding.  Musculoskeletal:     Cervical back: Neck supple.     Right lower leg: No edema.     Left lower leg: No edema.  Lymphadenopathy:     Cervical: No cervical adenopathy.  Skin:    General: Skin is warm and dry.  Neurological:     Mental Status: She is alert and oriented to person, place, and time.     Deep Tendon Reflexes:     Reflex Scores:      Patellar reflexes are 2+ on the right side and 2+ on the left side.    Comments: No noted acute cognitive deficit. Sensation grossly intact to light touch in the extremities.   Grip strengths equal bilaterally.   Strength 5/5 in all extremities.  No gait disturbance.  Coordination intact.  Cranial nerves III-XII grossly intact.  Handles oral secretions without noted difficulty.  No noted phonation or speech deficit. No facial droop.   Psychiatric:        Mood and Affect: Mood and affect normal.        Speech: Speech normal.        Behavior: Behavior normal.     ED Results / Procedures / Treatments   Labs (all labs ordered are listed, but only abnormal results are displayed) Labs Reviewed  COMPREHENSIVE METABOLIC PANEL - Abnormal; Notable for the following components:      Result Value   Calcium 8.8 (*)    All other components within normal limits  CBC WITH DIFFERENTIAL/PLATELET - Abnormal; Notable for the following components:   WBC 12.4 (*)    Hemoglobin 11.5 (*)    Neutro Abs 8.7 (*)    All other components within normal limits  LITHIUM LEVEL - Abnormal; Notable for the following components:   Lithium Lvl 0.17 (*)    All other components within normal limits  URINALYSIS, ROUTINE W REFLEX MICROSCOPIC - Abnormal; Notable for the following components:    APPearance HAZY (*)    Hgb urine dipstick LARGE (*)    RBC / HPF >50 (*)    All other components within normal limits  RAPID URINE DRUG SCREEN, HOSP PERFORMED - Abnormal; Notable for the following components:   Tetrahydrocannabinol POSITIVE (*)    All other components within normal limits  TSH - Abnormal; Notable for the following components:   TSH 7.455 (*)    All other components within normal limits  PREGNANCY, URINE   Hemoglobin  Date  Value Ref Range Status  10/28/2019 11.5 (L) 12.0 - 15.0 g/dL Final  02/16/2015 12.6 12.0 - 15.0 g/dL Final    EKG EKG Interpretation  Date/Time:  Tuesday October 28 2019 21:13:26 EDT Ventricular Rate:  85 PR Interval:    QRS Duration: 94 QT Interval:  371 QTC Calculation: 442 R Axis:   78 Text Interpretation: Sinus rhythm Borderline Q waves in inferior leads Confirmed by Milton Ferguson [phone]) on 10/28/2019 10:46:11 PM   Radiology No results found.  Procedures Procedures (including critical care time)  Medications Ordered in ED Medications  sodium chloride 0.9 % bolus 1,000 mL (0 mLs Intravenous Stopped 10/28/19 2211)  lithium carbonate capsule 300 mg (300 mg Oral Given 10/28/19 2244)    ED Course  I have reviewed the triage vital signs and the nursing notes.  Pertinent labs & imaging results that were available during my care of the patient were reviewed by me and considered in my medical decision making (see chart for details).  Clinical Course as of Oct 27 2249  Tue Oct 28, 2019  2250 Patient is currently menstruating.  Hgb urine dipstick(!): LARGE [SJ]    Clinical Course User Index [SJ] Rigdon Macomber, Helane Gunther, PA-C   MDM Rules/Calculators/A&P                      Patient presents with a complaint of generalized weakness, difficulty with concentration, occasional tremors.  There was a concern for possible lithium toxicity. I have personally reviewed and interpreted the patient's lab results. Mild leukocytosis nonspecific at this  time. Alternative pathologies were also considered.  My suspicion for serious postpartum related concerns, such as postpartum preeclampsia, is low after consideration of the patient's vital signs, duration from delivery, intermittent nature of her symptoms, normal liver function tests.  Her TSH was noted to be higher than normal.  Hypothyroidism could certainly explain the patient's symptoms.  Her presentation nor her vital signs suggest myxedema.  She was advised to discuss her symptoms with both her PCP and her OB/GYN.  Vitals:   10/28/19 1803 10/28/19 2159  BP: 136/75 126/87  Pulse: 95 100  Resp: 16 18  Temp: 97.7 F (36.5 C) 98 F (36.7 C)  TempSrc: Oral Oral  SpO2: 99% 100%  Weight: 114.3 kg   Height: 5\' 7"  (1.702 m)      Final Clinical Impression(s) / ED Diagnoses Final diagnoses:  Generalized weakness  Occasional tremors  Hypothyroidism, unspecified type    Rx / DC Orders ED Discharge Orders    None       Layla Maw 10/28/19 2251    Milton Ferguson, MD 10/29/19 1032

## 2019-10-28 NOTE — Discharge Instructions (Addendum)
Your thyroid testing showed some low thyroid function.  This could explain many of your symptoms.  This will need to be further assessed and addressed by your primary care provider.  Please contact them on this matter as soon as possible.

## 2019-10-28 NOTE — ED Triage Notes (Signed)
Pt presents to ED with complaints of tremors, bilateral ankle and leg swelling, slurred speech, memory loss x 2 couple weeks. Pt on Lithium and MD sent here to be evaluated and have levels checked. Recently increased Lithium dosage.
